# Patient Record
Sex: Male | Born: 1955 | Race: White | Hispanic: No | Marital: Married | Smoking: Never smoker
Health system: Southern US, Community
[De-identification: ages and names within clinical notes are randomized; demographics above are authoritative.]

## PROBLEM LIST (undated history)

## (undated) DIAGNOSIS — D3131 Benign neoplasm of right choroid: Secondary | ICD-10-CM

## (undated) DIAGNOSIS — R569 Unspecified convulsions: Secondary | ICD-10-CM

## (undated) DIAGNOSIS — C719 Malignant neoplasm of brain, unspecified: Secondary | ICD-10-CM

## (undated) DIAGNOSIS — R32 Unspecified urinary incontinence: Secondary | ICD-10-CM

## (undated) DIAGNOSIS — E785 Hyperlipidemia, unspecified: Secondary | ICD-10-CM

## (undated) DIAGNOSIS — R519 Headache, unspecified: Secondary | ICD-10-CM

## (undated) DIAGNOSIS — R51 Headache: Secondary | ICD-10-CM

## (undated) DIAGNOSIS — I10 Essential (primary) hypertension: Secondary | ICD-10-CM

## (undated) HISTORY — PX: BRAIN BIOPSY: SHX905

---

## 2013-03-10 DIAGNOSIS — C719 Malignant neoplasm of brain, unspecified: Secondary | ICD-10-CM

## 2013-03-10 HISTORY — DX: Malignant neoplasm of brain, unspecified: C71.9

## 2014-09-24 ENCOUNTER — Emergency Department (HOSPITAL_COMMUNITY): Payer: BLUE CROSS/BLUE SHIELD

## 2014-09-24 ENCOUNTER — Encounter (HOSPITAL_COMMUNITY): Payer: Self-pay | Admitting: Emergency Medicine

## 2014-09-24 ENCOUNTER — Observation Stay (HOSPITAL_COMMUNITY)
Admission: EM | Admit: 2014-09-24 | Discharge: 2014-09-25 | Disposition: A | Discharge status: Home / Self Care | Payer: BLUE CROSS/BLUE SHIELD | Attending: Internal Medicine | Admitting: Internal Medicine

## 2014-09-24 DIAGNOSIS — E871 Hypo-osmolality and hyponatremia: Secondary | ICD-10-CM | POA: Diagnosis not present

## 2014-09-24 DIAGNOSIS — C719 Malignant neoplasm of brain, unspecified: Secondary | ICD-10-CM | POA: Diagnosis present

## 2014-09-24 DIAGNOSIS — Z79899 Other long term (current) drug therapy: Secondary | ICD-10-CM | POA: Insufficient documentation

## 2014-09-24 DIAGNOSIS — I10 Essential (primary) hypertension: Secondary | ICD-10-CM | POA: Diagnosis not present

## 2014-09-24 DIAGNOSIS — Z7952 Long term (current) use of systemic steroids: Secondary | ICD-10-CM | POA: Diagnosis not present

## 2014-09-24 DIAGNOSIS — E785 Hyperlipidemia, unspecified: Secondary | ICD-10-CM | POA: Insufficient documentation

## 2014-09-24 DIAGNOSIS — Z86018 Personal history of other benign neoplasm: Secondary | ICD-10-CM | POA: Insufficient documentation

## 2014-09-24 DIAGNOSIS — R55 Syncope and collapse: Principal | ICD-10-CM | POA: Insufficient documentation

## 2014-09-24 DIAGNOSIS — E860 Dehydration: Secondary | ICD-10-CM | POA: Diagnosis present

## 2014-09-24 HISTORY — DX: Unspecified convulsions: R56.9

## 2014-09-24 HISTORY — DX: Hyperlipidemia, unspecified: E78.5

## 2014-09-24 HISTORY — DX: Unspecified urinary incontinence: R32

## 2014-09-24 HISTORY — DX: Headache, unspecified: R51.9

## 2014-09-24 HISTORY — DX: Malignant neoplasm of brain, unspecified: C71.9

## 2014-09-24 HISTORY — DX: Benign neoplasm of right choroid: D31.31

## 2014-09-24 HISTORY — DX: Essential (primary) hypertension: I10

## 2014-09-24 HISTORY — DX: Headache: R51

## 2014-09-24 LAB — URINALYSIS, ROUTINE W REFLEX MICROSCOPIC
Bilirubin Urine: NEGATIVE
GLUCOSE, UA: NEGATIVE mg/dL
Hgb urine dipstick: NEGATIVE
Ketones, ur: NEGATIVE mg/dL
Leukocytes, UA: NEGATIVE
NITRITE: NEGATIVE
PH: 6 (ref 5.0–8.0)
Protein, ur: NEGATIVE mg/dL
Specific Gravity, Urine: 1.015 (ref 1.005–1.030)
Urobilinogen, UA: 1 mg/dL (ref 0.0–1.0)

## 2014-09-24 LAB — CBC WITH DIFFERENTIAL/PLATELET
Basophils Absolute: 0 10*3/uL (ref 0.0–0.1)
Basophils Relative: 0 % (ref 0–1)
Eosinophils Absolute: 0 10*3/uL (ref 0.0–0.7)
Eosinophils Relative: 0 % (ref 0–5)
HCT: 41.2 % (ref 39.0–52.0)
Hemoglobin: 14.7 g/dL (ref 13.0–17.0)
Lymphocytes Relative: 4 % — ABNORMAL LOW (ref 12–46)
Lymphs Abs: 0.4 10*3/uL — ABNORMAL LOW (ref 0.7–4.0)
MCH: 32.1 pg (ref 26.0–34.0)
MCHC: 35.7 g/dL (ref 30.0–36.0)
MCV: 90 fL (ref 78.0–100.0)
Monocytes Absolute: 0.4 10*3/uL (ref 0.1–1.0)
Monocytes Relative: 4 % (ref 3–12)
NEUTROS PCT: 92 % — AB (ref 43–77)
Neutro Abs: 10.4 10*3/uL — ABNORMAL HIGH (ref 1.7–7.7)
PLATELETS: 140 10*3/uL — AB (ref 150–400)
RBC: 4.58 MIL/uL (ref 4.22–5.81)
RDW: 13.7 % (ref 11.5–15.5)
WBC: 11.2 10*3/uL — ABNORMAL HIGH (ref 4.0–10.5)

## 2014-09-24 LAB — COMPREHENSIVE METABOLIC PANEL
ALT: 26 U/L (ref 17–63)
ANION GAP: 9 (ref 5–15)
AST: 21 U/L (ref 15–41)
Albumin: 3.2 g/dL — ABNORMAL LOW (ref 3.5–5.0)
Alkaline Phosphatase: 40 U/L (ref 38–126)
BUN: 25 mg/dL — AB (ref 6–20)
CO2: 24 mmol/L (ref 22–32)
Calcium: 8.1 mg/dL — ABNORMAL LOW (ref 8.9–10.3)
Chloride: 95 mmol/L — ABNORMAL LOW (ref 101–111)
Creatinine, Ser: 0.92 mg/dL (ref 0.61–1.24)
GFR calc Af Amer: >60 mL/min (ref >60)
GFR calc non Af Amer: >60 mL/min (ref >60)
GLUCOSE: 119 mg/dL — AB (ref 65–99)
Potassium: 4.6 mmol/L (ref 3.5–5.1)
Sodium: 128 mmol/L — ABNORMAL LOW (ref 135–145)
TOTAL PROTEIN: 5.7 g/dL — AB (ref 6.5–8.1)
Total Bilirubin: 0.7 mg/dL (ref 0.3–1.2)

## 2014-09-24 LAB — TROPONIN I: Troponin I: <0.03 ng/mL (ref <0.031)

## 2014-09-24 LAB — LACTIC ACID, PLASMA
Lactic Acid, Venous: 1.8 mmol/L (ref 0.5–2.0)
Lactic Acid, Venous: 2 mmol/L (ref 0.5–2.0)

## 2014-09-24 LAB — TSH: TSH: 0.342 u[IU]/mL — ABNORMAL LOW (ref 0.350–4.500)

## 2014-09-24 MED ORDER — SENNA 8.6 MG PO TABS: 1.0000 | ORAL_TABLET | Freq: Every day | ORAL | Status: DC | PRN

## 2014-09-24 MED ORDER — DEXAMETHASONE 4 MG PO TABS
4.0000 mg | ORAL_TABLET | Freq: Two times a day (BID) | ORAL | Status: DC
  Administered 2014-09-24 – 2014-09-25 (×2): 4 mg via ORAL
  Filled 2014-09-24 (×6): qty 1

## 2014-09-24 MED ORDER — PROCHLORPERAZINE MALEATE 5 MG PO TABS: 10.0000 mg | ORAL_TABLET | Freq: Three times a day (TID) | ORAL | Status: DC | PRN

## 2014-09-24 MED ORDER — SODIUM CHLORIDE 0.9 % IV BOLUS (SEPSIS)
500.0000 mL | Freq: Once | INTRAVENOUS | Status: AC
  Administered 2014-09-24: 500 mL via INTRAVENOUS

## 2014-09-24 MED ORDER — SODIUM CHLORIDE 0.9 % IV SOLN
INTRAVENOUS | Status: DC
  Administered 2014-09-24: via INTRAVENOUS

## 2014-09-24 MED ORDER — NORTRIPTYLINE HCL 10 MG PO CAPS
20.0000 mg | ORAL_CAPSULE | Freq: Every day | ORAL | Status: DC
  Administered 2014-09-24: 20 mg via ORAL
  Filled 2014-09-24 (×3): qty 2

## 2014-09-24 MED ORDER — PANTOPRAZOLE SODIUM 40 MG PO TBEC
40.0000 mg | DELAYED_RELEASE_TABLET | Freq: Every day | ORAL | Status: DC
  Filled 2014-09-24: qty 1

## 2014-09-24 MED ORDER — LISINOPRIL 10 MG PO TABS
10.0000 mg | ORAL_TABLET | Freq: Every day | ORAL | Status: DC
  Administered 2014-09-25: 10 mg via ORAL
  Filled 2014-09-24: qty 1

## 2014-09-24 MED ORDER — SODIUM CHLORIDE 0.9 % IV SOLN
INTRAVENOUS | Status: AC
  Administered 2014-09-24: 23:00:00 via INTRAVENOUS

## 2014-09-24 MED ORDER — AMLODIPINE BESYLATE 5 MG PO TABS
5.0000 mg | ORAL_TABLET | Freq: Every day | ORAL | Status: DC
  Administered 2014-09-25: 5 mg via ORAL
  Filled 2014-09-24: qty 1

## 2014-09-24 MED ORDER — LEVETIRACETAM 500 MG PO TABS
500.0000 mg | ORAL_TABLET | Freq: Two times a day (BID) | ORAL | Status: DC
  Administered 2014-09-24 – 2014-09-25 (×2): 500 mg via ORAL
  Filled 2014-09-24 (×2): qty 1

## 2014-09-24 MED ORDER — FAMOTIDINE 20 MG PO TABS
20.0000 mg | ORAL_TABLET | Freq: Every day | ORAL | Status: DC
  Filled 2014-09-24: qty 1

## 2014-09-24 MED ORDER — SODIUM CHLORIDE 0.9 % IV SOLN
INTRAVENOUS | Status: DC
  Administered 2014-09-24: 20:00:00 via INTRAVENOUS

## 2014-09-24 MED ORDER — DEXAMETHASONE 4 MG PO TABS
ORAL_TABLET | ORAL | Status: AC
  Filled 2014-09-24: qty 1

## 2014-09-24 MED ORDER — NORTRIPTYLINE HCL 10 MG PO CAPS
ORAL_CAPSULE | ORAL | Status: AC
  Filled 2014-09-24: qty 2

## 2014-09-24 NOTE — ED Provider Notes (Signed)
CSN: [id number]     Arrival date & time 09/24/14  1648 History   First MD Initiated Contact with Patient 09/24/14 1650     Chief Complaint  Patient presents with  . Loss of Consciousness     HPI Pt was seen at 1655.  Per EMS, pt and his family, c/o sudden onset and resolution of 2 separate episodes of syncope that occurred yesterday and today. Pt states yesterday he was walking into a restaurant when he "felt weak" and "went to the floor" having a syncopal episode.  Pt states today he was being pushed in a wheelchair from a family member's house to his car, stood up to get in the car, "felt weak" and "then passed out." Pt states for the past 2 to 3 weeks pt has been experiencing "shaking episodes," and "urinary incontinence." Pt endorses chronic headache since dx with "a brain tumor." Pt's family states pt has had minimal PO intake for the past few days and they are concerned regarding dehydration.  EMS gave IV NS 557ml en route. Denies abd pain, no N/V/D, no fevers, no neck or back pain, no focal motor weakness, no tingling/numbness in extremities.     [facility]: Dr. Courtney Paris Past Medical History  Diagnosis Date  . Urinary incontinence   . Hypertension   . Seizures   . Choroidal nevus of right eye   . Hyperlipidemia   . Headache   . Astrocytoma brain tumor 2015    [facility]   History reviewed. No pertinent past surgical history.  History  Substance Use Topics  . Smoking status: Never Smoker   . Smokeless tobacco: Not on file  . Alcohol Use: No    Review of Systems ROS: Statement: All systems negative except as marked or noted in the HPI; Constitutional: Negative for fever and chills. ; ; Eyes: Negative for eye pain, redness and discharge. ; ; ENMT: Negative for ear pain, hoarseness, nasal congestion, sinus pressure and sore throat. ; ; Cardiovascular: Negative for chest pain, palpitations, diaphoresis, dyspnea and peripheral edema. ; ; Respiratory: Negative for cough, wheezing and  stridor. ; ; Gastrointestinal: +poor PO intake. Negative for nausea, vomiting, diarrhea, abdominal pain, blood in stool, hematemesis, jaundice and rectal bleeding. . ; ; Genitourinary: Negative for dysuria, flank pain and hematuria. ; ; Musculoskeletal: Negative for back pain and neck pain. Negative for swelling.; ; Skin: Negative for pruritus, rash, abrasions, blisters, bruising and skin lesion.; ; Neuro: Negative for neck stiffness. Negative for extremity weakness, paresthesias, +syncope, "shaking episodes," chronic headache.      Allergies  Hydromorphone  Home Medications   Prior to Admission medications   Medication Sig Start Date End Date Taking? Authorizing Provider  amLODipine (NORVASC) 5 MG tablet Take 5 mg by mouth daily. 08/25/14  Yes Historical Provider, MD  dexamethasone (DECADRON) 2 MG tablet Take 4 mg by mouth 2 (two) times daily. 09/09/14  Yes Historical Provider, MD  levETIRAcetam (KEPPRA) 500 MG tablet Take 500 mg by mouth 2 (two) times daily. 09/06/14  Yes Historical Provider, MD  lisinopril-hydrochlorothiazide (PRINZIDE,ZESTORETIC) 10-12.5 MG per tablet Take 1 tablet by mouth daily. 09/21/14  Yes Historical Provider, MD  nortriptyline (PAMELOR) 10 MG capsule Take 20 mg by mouth at bedtime. 09/15/14  Yes Historical Provider, MD  omeprazole (PRILOSEC) 20 MG capsule Take 40 mg by mouth at bedtime as needed (Heartburn).  09/15/14 09/15/15 Yes Historical Provider, MD  prochlorperazine (COMPAZINE) 10 MG tablet Take 10 mg by mouth 3 (three) times daily  as needed. 09/15/14  Yes Historical Provider, MD  ranitidine (ZANTAC) 150 MG tablet Take 150 mg by mouth daily as needed.   Yes Historical Provider, MD  acetaminophen (TYLENOL) 325 MG tablet Take 650 mg by mouth every 8 (eight) hours as needed.    Historical Provider, MD  GLEOSTINE 100 MG capsule Take 100 mg by mouth every 6 (six) weeks. 08/23/14   Historical Provider, MD  GLEOSTINE 40 MG capsule Take 80 mg by mouth every 6 (six) weeks. 08/23/14    Historical Provider, MD  senna (SENOKOT) 8.6 MG tablet Take 1 tablet by mouth daily as needed for constipation.     Historical Provider, MD   BP 111/93 mmHg  Pulse 77  Temp(Src) 97.9 F (36.6 C) (Oral)  Resp 11  Ht 5\' 7"  (1.702 m)  Wt 195 lb (88.451 kg)  BMI 30.53 kg/m2  SpO2 99%   Filed Vitals:   09/24/14 1800 09/24/14 1815 09/24/14 1830 09/24/14 1900  BP: 100/78  111/93 102/79  Pulse: 81 77  72  Temp:      TempSrc:      Resp: 13 15 11 13   Height:      Weight:      SpO2: 100% 99%  96%     17:11:14 Orthostatic Vital Signs JK  Orthostatic Lying  - BP- Lying: 103/73 mmHg ; Pulse- Lying: 82  Orthostatic Sitting - BP- Sitting: 119/80 mmHg ; Pulse- Sitting: 81  Orthostatic Standing at 0 minutes - BP- Standing at 0 minutes: 106/90 mmHg ; Pulse- Standing at 0 minutes: 76      Physical Exam 1700: Physical examination:  Nursing notes reviewed; Vital signs and O2 SAT reviewed;  Constitutional: Well developed, Well nourished, In no acute distress; Head:  Normocephalic, atraumatic; Eyes: EOMI, PERRL, No scleral icterus; ENMT: Mouth and pharynx normal, Mucous membranes dry; Neck: Supple, Full range of motion, No lymphadenopathy; Cardiovascular: Regular rate and rhythm, No gallop; Respiratory: Breath sounds clear & equal bilaterally, No wheezes.  Speaking full sentences with ease, Normal respiratory effort/excursion; Chest: Nontender, Movement normal; Abdomen: Soft, Nontender, Nondistended, Normal bowel sounds; Genitourinary: No CVA tenderness; Extremities: Pulses normal, No tenderness, No edema, No calf edema or asymmetry.; Neuro: AA&Ox3, Major CN grossly intact. No facial droop. Speech clear. Grips equal. Strength 5/5 equal bilat UE's and LE's. Moves all extremities spontaneously and to command without apparent gross focal motor deficits.; Skin: Color normal, Warm, Dry.    ED Course  Procedures     EKG Interpretation   Date/Time:  Sunday September 24 2014 17:09:47 EDT Ventricular Rate:   72 PR Interval:  175 QRS Duration: 113 QT Interval:  394 QTC Calculation: 431 R Axis:   83 Text Interpretation:  Sinus rhythm Borderline intraventricular conduction  delay Borderline low voltage, extremity leads Artifact No old tracing to  compare Confirmed by [facility]  MD, Nunzio Cory [phone]) on 09/24/2014 6:11:48 PM      MDM  MDM Reviewed: nursing note, vitals and previous chart Reviewed previous: MRI Interpretation: labs, ECG, x-ray and CT scan Total time providing critical care: 30-74 minutes. This excludes time spent performing separately reportable procedures and services. Consults: admitting MD   CRITICAL CARE Performed by: Alfonzo Feller Total critical care time: 35 Critical care time was exclusive of separately billable procedures and treating other patients. Critical care was necessary to treat or prevent imminent or life-threatening deterioration. Critical care was time spent personally by me on the following activities: development of treatment plan with patient and/or surrogate as well  as nursing, discussions with consultants, evaluation of patient's response to treatment, examination of patient, obtaining history from patient or surrogate, ordering and performing treatments and interventions, ordering and review of laboratory studies, ordering and review of radiographic studies, pulse oximetry and re-evaluation of patient's condition.    Results for orders placed or performed during the hospital encounter of 09/24/14  Urinalysis, Routine w reflex microscopic (not at [facility])  Result Value Ref Range   Color, Urine YELLOW YELLOW   APPearance CLEAR CLEAR   Specific Gravity, Urine 1.015 1.005 - 1.030   pH 6.0 5.0 - 8.0   Glucose, UA NEGATIVE NEGATIVE mg/dL   Hgb urine dipstick NEGATIVE NEGATIVE   Bilirubin Urine NEGATIVE NEGATIVE   Ketones, ur NEGATIVE NEGATIVE mg/dL   Protein, ur NEGATIVE NEGATIVE mg/dL   Urobilinogen, UA 1.0 0.0 - 1.0 mg/dL   Nitrite NEGATIVE  NEGATIVE   Leukocytes, UA NEGATIVE NEGATIVE  Comprehensive metabolic panel  Result Value Ref Range   Sodium 128 (L) 135 - 145 mmol/L   Potassium 4.6 3.5 - 5.1 mmol/L   Chloride 95 (L) 101 - 111 mmol/L   CO2 24 22 - 32 mmol/L   Glucose, Bld 119 (H) 65 - 99 mg/dL   BUN 25 (H) 6 - 20 mg/dL   Creatinine, Ser 0.92 0.61 - 1.24 mg/dL   Calcium 8.1 (L) 8.9 - 10.3 mg/dL   Total Protein 5.7 (L) 6.5 - 8.1 g/dL   Albumin 3.2 (L) 3.5 - 5.0 g/dL   AST 21 15 - 41 U/L   ALT 26 17 - 63 U/L   Alkaline Phosphatase 40 38 - 126 U/L   Total Bilirubin 0.7 0.3 - 1.2 mg/dL   GFR calc non Af Amer >60 >60 mL/min   GFR calc Af Amer >60 >60 mL/min   Anion gap 9 5 - 15  Troponin I  Result Value Ref Range   Troponin I <0.03 <0.031 ng/mL  Lactic acid, plasma  Result Value Ref Range   Lactic Acid, Venous 1.8 0.5 - 2.0 mmol/L  CBC with Differential  Result Value Ref Range   WBC 11.2 (H) 4.0 - 10.5 K/uL   RBC 4.58 4.22 - 5.81 MIL/uL   Hemoglobin 14.7 13.0 - 17.0 g/dL   HCT 41.2 39.0 - 52.0 %   MCV 90.0 78.0 - 100.0 fL   MCH 32.1 26.0 - 34.0 pg   MCHC 35.7 30.0 - 36.0 g/dL   RDW 13.7 11.5 - 15.5 %   Platelets 140 (L) 150 - 400 K/uL   Neutrophils Relative % 92 (H) 43 - 77 %   Neutro Abs 10.4 (H) 1.7 - 7.7 K/uL   Lymphocytes Relative 4 (L) 12 - 46 %   Lymphs Abs 0.4 (L) 0.7 - 4.0 K/uL   Monocytes Relative 4 3 - 12 %   Monocytes Absolute 0.4 0.1 - 1.0 K/uL   Eosinophils Relative 0 0 - 5 %   Eosinophils Absolute 0.0 0.0 - 0.7 K/uL   Basophils Relative 0 0 - 1 %   Basophils Absolute 0.0 0.0 - 0.1 K/uL   Dg Chest 2 View 09/24/2014   CLINICAL DATA:  Dehydration  EXAM: CHEST - 2 VIEW  COMPARISON:  None.  FINDINGS: The heart size and mediastinal contours are within normal limits. Both lungs are clear. The visualized skeletal structures are unremarkable.  IMPRESSION: No active disease.   Electronically Signed   By: Inez Catalina M.D.   On: 09/24/2014 17:55   Ct Head Wo Contrast  09/24/2014   CLINICAL DATA:  Loss  of consciousness, dehydration, headache  EXAM: CT HEAD WITHOUT CONTRAST  TECHNIQUE: Contiguous axial images were obtained from the base of the skull through the vertex without intravenous contrast.  COMPARISON:  None.  FINDINGS: Infiltrating hypodense mass with calcifications in the right frontoparietal region and extending into the right temporal lobe, measuring 6.1 x 5.2 cm (series 2/ image 22), corresponding to patient's known astrocytoma.  Faint hyperdensity along the anterior/inferior aspect of the mass could reflect a small amount of hemorrhage (series 2/ image 65), age indeterminate in this patient with history of recent prior hemorrhage.  Mild mass effect with overlying sulcal effacement. No midline shift. Basal cisterns remain patent.  No evidence of intraventricular hemorrhage.  No hydrocephalus.  Subcortical white matter and periventricular small vessel ischemic changes.  Cerebral volume is within normal limits.  No ventriculomegaly.  The visualized paranasal sinuses are essentially clear. The mastoid air cells are unopacified.  No evidence of calvarial fracture.  IMPRESSION: Infiltrating 6.1 cm mass in the right frontoparietal region extending into the right temporal lobe, corresponding to known astrocytoma.  Faint hyperdensity along the anterior/inferior aspect of the mass could reflect a small amount of hemorrhage, age indeterminate in this patient with history of recent prior hemorrhage.  No midline shift, intraventricular hemorrhage, or hydrocephalus. Basal cisterns remain patent.   Electronically Signed   By: Julian Hy M.D.   On: 09/24/2014 18:04    2005:  Care Everywhere records reviewed ([facility]):  MRI brain 09/15/14 with stable hemorrhage, Na baseline ranges 132-140.  No hx of hyponatremia. +orthostatic on VS. Total IVF NS x1L given (EMS and ED). Pt unable to urinate at this time; will continue IVF, I&O cath prn.  Neuro exam remains intact and unchanged. T/C to [facility] Dr. Veryl Speak (on  call for Dr. Simmie Davies), case discussed, including:  HPI, pertinent PM/SHx, VS/PE, dx testing, ED course and treatment:  [facility] does not have any beds at this time, will place pt on waiting list for tomorrow, pt's symptoms may be due to simple dehydration, admit to tele floor at [facility], give IVF and re-check labs in the morning; request to call [facility] Dr. Simmie Davies tomorrow morning; if pt is improving, pt may be able to be d/c to home, if he is not then he will need transfer to [facility] (they expect beds to open up tomorrow morning).   2030:  Dx and testing, as well as d/w Randon Goldsmith MD, d/w pt and family.  Questions answered.  Verb understanding, agreeable to admit. T/C to [facility] Dr. Maudie Mercury, case discussed, including:  HPI, pertinent PM/SHx, VS/PE, dx testing, ED course and treatment, including d/w [facility] MD:  Agreeable to admit, requests to write temporary orders, obtain observation tele bed to team APAdmits.   Francine Graven, DO 09/26/14 2224

## 2014-09-24 NOTE — ED Notes (Addendum)
Per EMS, pt had episode of LOC,syncope going from house to the car. Per EMS, pt family wants pt evaluated for dehydration due to minimal intake last several days. Pt reports headache at time of arrival. Pt reports several episodes of urinary incontinence and "seizure like" activity for last several weeks. Pt alert and oriented. Airway patent.

## 2014-09-24 NOTE — ED Notes (Signed)
Lab at bedside

## 2014-09-24 NOTE — H&P (Signed)
William Reyes is an 59 y.o. male.    William Reyes (pcp, [place], [place]) [facility]ology  Chief Complaint: syncope? HPI: 59 yo male with hx of astrocytoma, c/o headache/dizziness with standing up.  Pt had been resting today and ate lunch and then was wheeled out to car and sat there for a minute and went to get up and legs felt weak and he felt weak in general, sat down on the ground.  He may have had LOC for a minute.  Pt was able to respond to commands.  Pt presented to ED and was found to be orthostatic, and hyponatremic.  Dr. Thurnell Garbe apparently called [facility] who suggested hydration with ns iv for hyponatremia and orthostasis and if tomorrow not better to contact [facility] for transfer to [facility] as there were no beds tonight.   Past Medical History  Diagnosis Date  . Urinary incontinence   . Hypertension   . Seizures   . Choroidal nevus of right eye   . Hyperlipidemia   . Headache   . Astrocytoma brain tumor 2015    [facility]    Past Surgical History  Procedure Laterality Date  . Brain biopsy      Family History  Problem Relation Age of Onset  . Cancer Maternal Uncle    Social History:  reports that he has never smoked. He does not have any smokeless tobacco history on file. He reports that he does not drink alcohol or use illicit drugs.  Allergies:  Allergies  Allergen Reactions  . Hydromorphone Nausea And Vomiting and Other (See Comments)    Hallucinations    Medications reviewed  Results for orders placed or performed during the hospital encounter of 09/24/14 (from the past 48 hour(s))  Lactic acid, plasma     Status: None   Collection Time: 09/24/14  6:08 PM  Result Value Ref Range   Lactic Acid, Venous 1.8 0.5 - 2.0 mmol/L  Comprehensive metabolic panel     Status: Abnormal   Collection Time: 09/24/14  6:13 PM  Result Value Ref Range   Sodium 128 (L) 135 - 145 mmol/L   Potassium 4.6 3.5 - 5.1 mmol/L   Chloride 95 (L) 101 - 111 mmol/L   CO2 24 22 - 32 mmol/L    Glucose, Bld 119 (H) 65 - 99 mg/dL   BUN 25 (H) 6 - 20 mg/dL   Creatinine, Ser 0.92 0.61 - 1.24 mg/dL   Calcium 8.1 (L) 8.9 - 10.3 mg/dL   Total Protein 5.7 (L) 6.5 - 8.1 g/dL   Albumin 3.2 (L) 3.5 - 5.0 g/dL   AST 21 15 - 41 U/L   ALT 26 17 - 63 U/L   Alkaline Phosphatase 40 38 - 126 U/L   Total Bilirubin 0.7 0.3 - 1.2 mg/dL   GFR calc non Af Amer >60 >60 mL/min   GFR calc Af Amer >60 >60 mL/min    Comment: (NOTE) The eGFR has been calculated using the CKD EPI equation. This calculation has not been validated in all clinical situations. eGFR's persistently <60 mL/min signify possible Chronic Kidney Disease.    Anion gap 9 5 - 15  Troponin I     Status: None   Collection Time: 09/24/14  6:13 PM  Result Value Ref Range   Troponin I <0.03 <0.031 ng/mL    Comment:        NO INDICATION OF MYOCARDIAL INJURY.   CBC with Differential     Status: Abnormal  Collection Time: 09/24/14  6:13 PM  Result Value Ref Range   WBC 11.2 (H) 4.0 - 10.5 K/uL   RBC 4.58 4.22 - 5.81 MIL/uL   Hemoglobin 14.7 13.0 - 17.0 g/dL   HCT 41.2 39.0 - 52.0 %   MCV 90.0 78.0 - 100.0 fL   MCH 32.1 26.0 - 34.0 pg   MCHC 35.7 30.0 - 36.0 g/dL   RDW 13.7 11.5 - 15.5 %   Platelets 140 (L) 150 - 400 K/uL   Neutrophils Relative % 92 (H) 43 - 77 %   Neutro Abs 10.4 (H) 1.7 - 7.7 K/uL   Lymphocytes Relative 4 (L) 12 - 46 %   Lymphs Abs 0.4 (L) 0.7 - 4.0 K/uL   Monocytes Relative 4 3 - 12 %   Monocytes Absolute 0.4 0.1 - 1.0 K/uL   Eosinophils Relative 0 0 - 5 %   Eosinophils Absolute 0.0 0.0 - 0.7 K/uL   Basophils Relative 0 0 - 1 %   Basophils Absolute 0.0 0.0 - 0.1 K/uL   Dg Chest 2 View  09/24/2014   CLINICAL DATA:  Dehydration  EXAM: CHEST - 2 VIEW  COMPARISON:  None.  FINDINGS: The heart size and mediastinal contours are within normal limits. Both lungs are clear. The visualized skeletal structures are unremarkable.  IMPRESSION: No active disease.   Electronically Signed   By: Inez Catalina M.D.   On:  09/24/2014 17:55   Ct Head Wo Contrast  09/24/2014   CLINICAL DATA:  Loss of consciousness, dehydration, headache  EXAM: CT HEAD WITHOUT CONTRAST  TECHNIQUE: Contiguous axial images were obtained from the base of the skull through the vertex without intravenous contrast.  COMPARISON:  None.  FINDINGS: Infiltrating hypodense mass with calcifications in the right frontoparietal region and extending into the right temporal lobe, measuring 6.1 x 5.2 cm (series 2/ image 22), corresponding to patient's known astrocytoma.  Faint hyperdensity along the anterior/inferior aspect of the mass could reflect a small amount of hemorrhage (series 2/ image 44), age indeterminate in this patient with history of recent prior hemorrhage.  Mild mass effect with overlying sulcal effacement. No midline shift. Basal cisterns remain patent.  No evidence of intraventricular hemorrhage.  No hydrocephalus.  Subcortical white matter and periventricular small vessel ischemic changes.  Cerebral volume is within normal limits.  No ventriculomegaly.  The visualized paranasal sinuses are essentially clear. The mastoid air cells are unopacified.  No evidence of calvarial fracture.  IMPRESSION: Infiltrating 6.1 cm mass in the right frontoparietal region extending into the right temporal lobe, corresponding to known astrocytoma.  Faint hyperdensity along the anterior/inferior aspect of the mass could reflect a small amount of hemorrhage, age indeterminate in this patient with history of recent prior hemorrhage.  No midline shift, intraventricular hemorrhage, or hydrocephalus. Basal cisterns remain patent.   Electronically Signed   By: Julian Hy M.D.   On: 09/24/2014 18:04    Review of Systems  Constitutional: Negative for fever, chills, weight loss, malaise/fatigue and diaphoresis.  HENT: Negative.   Eyes: Negative.   Respiratory: Negative.   Cardiovascular: Negative.   Gastrointestinal: Negative.   Genitourinary: Negative.    Musculoskeletal: Negative.   Skin: Negative.   Neurological: Positive for dizziness, loss of consciousness and weakness. Negative for tingling, tremors, sensory change, focal weakness and seizures.  Endo/Heme/Allergies: Negative.   Psychiatric/Behavioral: Negative.     Blood pressure 102/79, pulse 72, temperature 97.9 F (36.6 C), temperature source Oral, resp. rate 13, height 5'  7" (1.702 m), weight 88.451 kg (195 lb), SpO2 96 %. Physical Exam  Constitutional: He is oriented to person, place, and time. He appears well-developed and well-nourished.  HENT:  Head: Normocephalic and atraumatic.  Mouth/Throat: No oropharyngeal exudate.  Eyes: Conjunctivae and EOM are normal. Pupils are equal, round, and reactive to light. No scleral icterus.  Neck: Normal range of motion. Neck supple. No JVD present. No tracheal deviation present. No thyromegaly present.  Cardiovascular: Normal rate and regular rhythm.  Exam reveals no gallop and no friction rub.   No murmur heard. Respiratory: Effort normal and breath sounds normal. No respiratory distress. He has no wheezes. He has no rales.  GI: Soft. Bowel sounds are normal. He exhibits no distension. There is no tenderness. There is no rebound and no guarding.  Musculoskeletal: Normal range of motion. He exhibits no edema or tenderness.  Lymphadenopathy:    He has no cervical adenopathy.  Neurological: He is alert and oriented to person, place, and time. He has normal reflexes. He displays normal reflexes. No cranial nerve deficit. He exhibits normal muscle tone. Coordination normal.  Skin: Skin is warm and dry. No rash noted. No erythema. No pallor.  Psychiatric: He has a normal mood and affect. His behavior is normal. Judgment and thought content normal.     Assessment/Plan Syncope Tele Trop i q6hx 3 Carotid US Cardiac echo  Hyponatremia Hydrate with ns iv Check serum osm, tsh, cortisol Check urine sodium, urine  osm  Thrombocytopenia Check cbc in am  Hyperglycemia Check hga1c  DVT prophylaxis:  scd  Disp:  Please call [facility] in am if not improving for transfer to there Jani Gravel 09/24/2014, 9:02 PM

## 2014-09-24 NOTE — ED Notes (Signed)
Pt attempting to void at this time. nad noted.

## 2014-09-24 NOTE — ED Notes (Signed)
In and out cath completed with assistance of NT after pt and wife agreed to procedure. Sample obtained and bladder emptied. Approx. 300 ml obtained. Pt tolerated well.

## 2014-09-24 NOTE — ED Notes (Signed)
Pt not able to give urine sample at this time. nad noted. Pt resting comfortably in bed.

## 2014-09-25 ENCOUNTER — Observation Stay (HOSPITAL_COMMUNITY): Payer: BLUE CROSS/BLUE SHIELD

## 2014-09-25 DIAGNOSIS — C719 Malignant neoplasm of brain, unspecified: Secondary | ICD-10-CM

## 2014-09-25 DIAGNOSIS — I10 Essential (primary) hypertension: Secondary | ICD-10-CM

## 2014-09-25 DIAGNOSIS — E871 Hypo-osmolality and hyponatremia: Secondary | ICD-10-CM

## 2014-09-25 DIAGNOSIS — E860 Dehydration: Secondary | ICD-10-CM

## 2014-09-25 DIAGNOSIS — R55 Syncope and collapse: Secondary | ICD-10-CM

## 2014-09-25 LAB — CBC
HCT: 41 % (ref 39.0–52.0)
Hemoglobin: 14.8 g/dL (ref 13.0–17.0)
MCH: 32.5 pg (ref 26.0–34.0)
MCHC: 36.1 g/dL — ABNORMAL HIGH (ref 30.0–36.0)
MCV: 89.9 fL (ref 78.0–100.0)
Platelets: 141 10*3/uL — ABNORMAL LOW (ref 150–400)
RBC: 4.56 MIL/uL (ref 4.22–5.81)
RDW: 13.5 % (ref 11.5–15.5)
WBC: 9.9 10*3/uL (ref 4.0–10.5)

## 2014-09-25 LAB — COMPREHENSIVE METABOLIC PANEL
ALBUMIN: 3.1 g/dL — AB (ref 3.5–5.0)
ALK PHOS: 42 U/L (ref 38–126)
ALT: 24 U/L (ref 17–63)
ANION GAP: 7 (ref 5–15)
AST: 16 U/L (ref 15–41)
BUN: 22 mg/dL — AB (ref 6–20)
CO2: 25 mmol/L (ref 22–32)
CREATININE: 0.76 mg/dL (ref 0.61–1.24)
Calcium: 8.4 mg/dL — ABNORMAL LOW (ref 8.9–10.3)
Chloride: 98 mmol/L — ABNORMAL LOW (ref 101–111)
GFR calc Af Amer: >60 mL/min (ref >60)
GFR calc non Af Amer: >60 mL/min (ref >60)
Glucose, Bld: 105 mg/dL — ABNORMAL HIGH (ref 65–99)
Potassium: 4.8 mmol/L (ref 3.5–5.1)
SODIUM: 130 mmol/L — AB (ref 135–145)
Total Bilirubin: 0.8 mg/dL (ref 0.3–1.2)
Total Protein: 5.6 g/dL — ABNORMAL LOW (ref 6.5–8.1)

## 2014-09-25 LAB — OSMOLALITY: Osmolality: 279 mOsm/kg (ref 275–300)

## 2014-09-25 LAB — TROPONIN I: Troponin I: <0.03 ng/mL (ref <0.031)

## 2014-09-25 LAB — SODIUM, URINE, RANDOM: Sodium, Ur: 81 mmol/L

## 2014-09-25 LAB — CORTISOL: Cortisol, Plasma: 1.3 ug/dL

## 2014-09-25 LAB — OSMOLALITY, URINE: Osmolality, Ur: 454 mOsm/kg (ref 390–1090)

## 2014-09-25 MED ORDER — SODIUM CHLORIDE 0.9 % IV BOLUS (SEPSIS)
1000.0000 mL | Freq: Once | INTRAVENOUS | Status: AC
  Administered 2014-09-25: 1000 mL via INTRAVENOUS

## 2014-09-25 NOTE — Care Management Note (Signed)
Case Management Note  Patient Details  Name: William Reyes MRN: [id number] Date of Birth: 1956/01/28  Expected Discharge Date:  09/25/14               Expected Discharge Plan:  Home/Self Care  In-House Referral:  Financial Counselor  Discharge planning Services  CM Consult  Post Acute Care Choice:  NA Choice offered to:  NA  DME Arranged:    DME Agency:     HH Arranged:    [place]:     Status of Service:  Completed, signed off  Medicare Important Message Given:    Date Medicare IM Given:    Medicare IM give by:    Date Additional Medicare IM Given:    Additional Medicare Important Message give by:     If discussed at [place] of Stay Meetings, dates discussed:    Additional Comments: Pt is from home and independent at baseline. Pt admitted for syncope. Pt undergoing cancer treatment at [facility]. Pt uninsured and has been referred to financial counselor for assistance. Patient discharging home today with self care. No new medications have been prescribed. Pt has no CM needs.   Sherald Barge, RN 09/25/2014, 1:37 PM

## 2014-09-25 NOTE — Progress Notes (Signed)
Patient Saturations on Room Air at Rest =100%  Patient Saturations on Room Air while Ambulating = 99%   

## 2014-09-25 NOTE — Discharge Summary (Signed)
Physician Discharge Summary  Real Cona JIR:[id number] DOB: 18-May-1955 DOA: 09/24/2014  PCP: Tempie Hoist, FNP  Admit date: 09/24/2014 Discharge date: 09/25/2014  Time spent: 40 minutes  Recommendations for Outpatient Follow-up:  1. Has follow up appointment with oncology at [facility] 09/29/14  Discharge Diagnoses:  Active Problems:   Syncope   Hyponatremia   Dehydration   Hypertension   Astrocytoma brain tumor   Discharge Condition: stable  Diet recommendation: heart healthy  Filed Weights   09/24/14 1650 09/24/14 2255 09/25/14 0544  Weight: 88.451 kg (195 lb) 88.45 kg (195 lb) 82.509 kg (181 lb 14.4 oz)    History of present illness:  59 yo male with hx of astrocytoma, c/o headache/dizziness with standing up presented to ED on 09/24/14. Pt had been resting and ate lunch and then was wheeled out to car and sat there for a minute and went to get up and legs felt weak and he felt weak in general, sat down on the ground. He may have had LOC for a minute. Pt was able to respond to commands. Pt presented to ED and was found to be orthostatic, and hyponatremic. Dr. Thurnell Garbe apparently called [facility] who suggested hydration with ns iv for hyponatremia and orthostasis and if tomorrow not better to contact [place] oncology for transfer to [facility] as there were no beds    Hospital Course:  Syncope: patient denies LOC. Reports symptoms somewhat chronic since treatment for brain tumor. No events on tele. Trop i negative x3. Evaluation/labs revealed dehydration. He received IV fluids and on day of discharge BP improved but slightly orthostatic from sitting to standing but rebounds after 121. Patient ambulated on day of discharge and reports dizziness at his baseline. He has appointment scheduled with oncology at [facility] 09/29/14.   Hyponatremia: he was hydrated with ns iv. At discharge sodium 130. He is not sure of what his baseline sodium is since beginning treatment. Urine sodium 81, urine  osmolality pending, serum osmolality pending and cortisol pending.  TSH 0.342.  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 Stable. No s/sx bleeding  Hyperglycemia: mild. Serum glucose 105 at discharge. likeley related to decedron  Procedures:  none  Consultations:  none  Discharge Exam: Filed Vitals:   09/25/14 0912  BP: 110/74  Pulse: 69  Temp: 97.7 F (36.5 C)  Resp: 20    General: well nourished appears comfortable Cardiovascular: rrr no MGR No LE edema Respiratory: normal effort BS clear bilaterally no wheeze  Discharge Instructions    Current Discharge Medication List    CONTINUE these medications which have NOT CHANGED   Details  amLODipine (NORVASC) 5 MG tablet Take 5 mg by mouth daily.    dexamethasone (DECADRON) 2 MG tablet Take 4 mg by mouth 2 (two) times daily.    levETIRAcetam (KEPPRA) 500 MG tablet Take 500 mg by mouth 2 (two) times daily.    lisinopril-hydrochlorothiazide (PRINZIDE,ZESTORETIC) 10-12.5 MG per tablet Take 1 tablet by mouth daily.    nortriptyline (PAMELOR) 10 MG capsule Take 20 mg by mouth at bedtime.    omeprazole (PRILOSEC) 20 MG capsule Take 40 mg by mouth at bedtime as needed (Heartburn).     prochlorperazine (COMPAZINE) 10 MG tablet Take 10 mg by mouth 3 (three) times daily as needed.    ranitidine (ZANTAC) 150 MG tablet Take 150 mg by mouth daily as needed.    acetaminophen (TYLENOL) 325 MG tablet Take 650 mg by mouth every  8 (eight) hours as needed.    !! GLEOSTINE 100 MG capsule Take 100 mg by mouth every 6 (six) weeks.    !! GLEOSTINE 40 MG capsule Take 80 mg by mouth every 6 (six) weeks.    senna (SENOKOT) 8.6 MG tablet Take 1 tablet by mouth daily as needed for constipation.      !! - Potential duplicate medications found. Please discuss with provider.     Allergies  Allergen Reactions  . Hydromorphone Nausea And Vomiting and Other (See Comments)     Hallucinations       The results of significant diagnostics from this hospitalization (including imaging, microbiology, ancillary and laboratory) are listed below for reference.    Significant Diagnostic Studies: Dg Chest 2 View  09/24/2014   CLINICAL DATA:  Dehydration  EXAM: CHEST - 2 VIEW  COMPARISON:  None.  FINDINGS: The heart size and mediastinal contours are within normal limits. Both lungs are clear. The visualized skeletal structures are unremarkable.  IMPRESSION: No active disease.   Electronically Signed   By: Inez Catalina M.D.   On: 09/24/2014 17:55   Ct Head Wo Contrast  09/24/2014   CLINICAL DATA:  Loss of consciousness, dehydration, headache  EXAM: CT HEAD WITHOUT CONTRAST  TECHNIQUE: Contiguous axial images were obtained from the base of the skull through the vertex without intravenous contrast.  COMPARISON:  None.  FINDINGS: Infiltrating hypodense mass with calcifications in the right frontoparietal region and extending into the right temporal lobe, measuring 6.1 x 5.2 cm (series 2/ image 22), corresponding to patient's known astrocytoma.  Faint hyperdensity along the anterior/inferior aspect of the mass could reflect a small amount of hemorrhage (series 2/ image 32), age indeterminate in this patient with history of recent prior hemorrhage.  Mild mass effect with overlying sulcal effacement. No midline shift. Basal cisterns remain patent.  No evidence of intraventricular hemorrhage.  No hydrocephalus.  Subcortical white matter and periventricular small vessel ischemic changes.  Cerebral volume is within normal limits.  No ventriculomegaly.  The visualized paranasal sinuses are essentially clear. The mastoid air cells are unopacified.  No evidence of calvarial fracture.  IMPRESSION: Infiltrating 6.1 cm mass in the right frontoparietal region extending into the right temporal lobe, corresponding to known astrocytoma.  Faint hyperdensity along the anterior/inferior aspect of the mass could  reflect a small amount of hemorrhage, age indeterminate in this patient with history of recent prior hemorrhage.  No midline shift, intraventricular hemorrhage, or hydrocephalus. Basal cisterns remain patent.   Electronically Signed   By: Julian Hy M.D.   On: 09/24/2014 18:04    Microbiology: No results found for this or any previous visit (from the past 240 hour(s)).   Labs: Basic Metabolic Panel:  Recent Labs Lab 09/24/14 1813 09/25/14 0555  NA 128* 130*  K 4.6 4.8  CL 95* 98*  CO2 24 25  GLUCOSE 119* 105*  BUN 25* 22*  CREATININE 0.92 0.76  CALCIUM 8.1* 8.4*   Liver Function Tests:  Recent Labs Lab 09/24/14 1813 09/25/14 0555  AST 21 16  ALT 26 24  ALKPHOS 40 42  BILITOT 0.7 0.8  PROT 5.7* 5.6*  ALBUMIN 3.2* 3.1*   No results for input(s): LIPASE, AMYLASE in the last 168 hours. No results for input(s): AMMONIA in the last 168 hours. CBC:  Recent Labs Lab 09/24/14 1813 09/25/14 0555  WBC 11.2* 9.9  NEUTROABS 10.4*  --   HGB 14.7 14.8  HCT 41.2 41.0  MCV 90.0 89.9  PLT 140* 141*   Cardiac Enzymes:  Recent Labs Lab 09/24/14 1813 09/24/14 2144 09/25/14 0231 09/25/14 0840  TROPONINI <0.03 <0.03 <0.03 <0.03   BNP: BNP (last 3 results) No results for input(s): BNP in the last 8760 hours.  ProBNP (last 3 results) No results for input(s): PROBNP in the last 8760 hours.  CBG: No results for input(s): GLUCAP in the last 168 hours.     SignedRadene Gunning  [facility] 09/25/2014, 12:36 PM   Attending note:  Patient seen and examined. Note reviewed as per Dyanne Carrel, NP.  This patient was admitted to the hospital with syncope, dehydration and hyponatremia. He was found to be orthostatic. Review of medications indicate that he was on thiazide diuretics. The patient was hydrated with intravenous saline with improvement of his serum sodium. He is feeling significantly improved. He is no longer lightheaded or dizzy when he is  ambulating. He was advised to continue with oral hydration at home. We'll hold his lisinopril/hydrochlorothiazide and amlodipine for now since blood pressures on the lower side. He plans to follow-up with his oncologist later this week. His blood pressure can be reviewed and if trending up, these medications may be resumed. Currently, he does not have any evidence of infection. The patient is otherwise stable for discharge.  MEMON,JEHANZEB

## 2014-09-25 NOTE — Progress Notes (Signed)
Pt's IV catheter removed and intact. Pt's IV site clean dry and intact. Discharge instructions and medications reviewed with patient. All questions were answered and no further questions at this time. Pt in stable condition and in no acute distress at time of discharge.

## 2014-12-09 DEATH — deceased

## 2015-12-07 IMAGING — CT CT HEAD W/O CM
1 series · 15 of 30 positions shown, 19 images · non-contrast
Comparison: None.

CLINICAL DATA: Loss of consciousness, dehydration, headache

EXAM:
CT HEAD WITHOUT CONTRAST
TECHNIQUE: Contiguous axial images were obtained from the base of the skull
through the vertex without intravenous contrast.

[Series 2: headseq 4.8 h37s · axial · 0.43mm/px · z∈[+106,+243]mm · 15 of 33 slices shown, 19 images]
[im 2/33  brain]
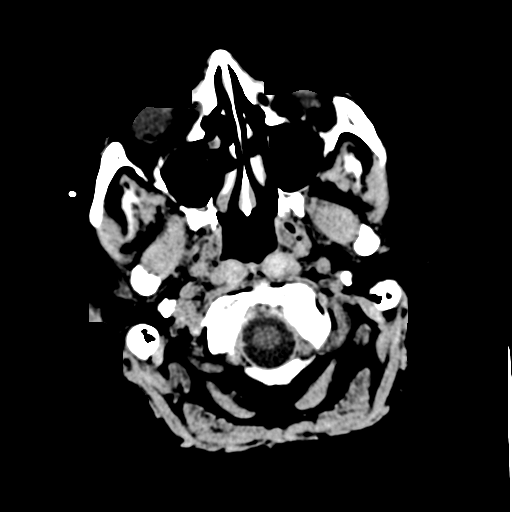
[im 2/33  bone]
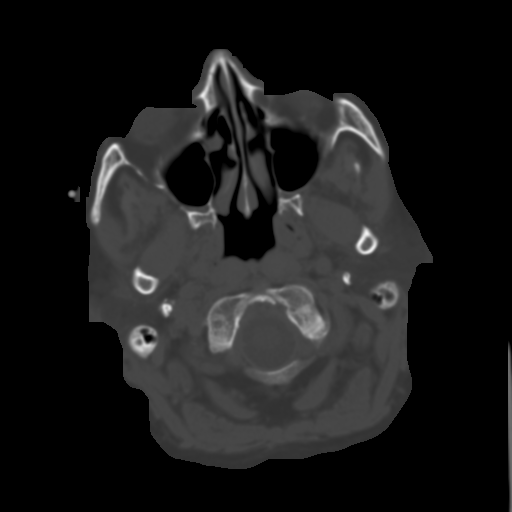
[im 4/33  brain]
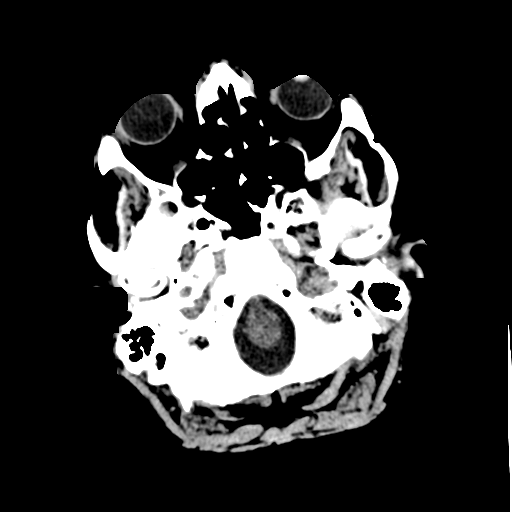
[im 6/33  brain]
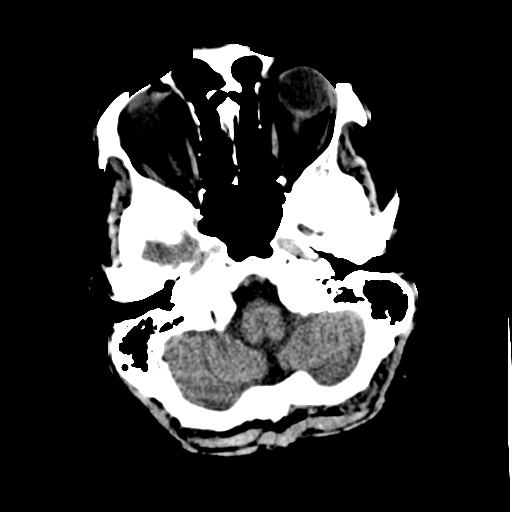
[im 8/33  brain]
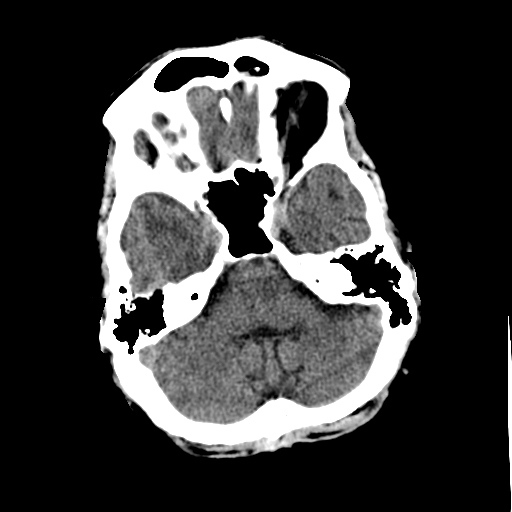
[im 10/33  brain]
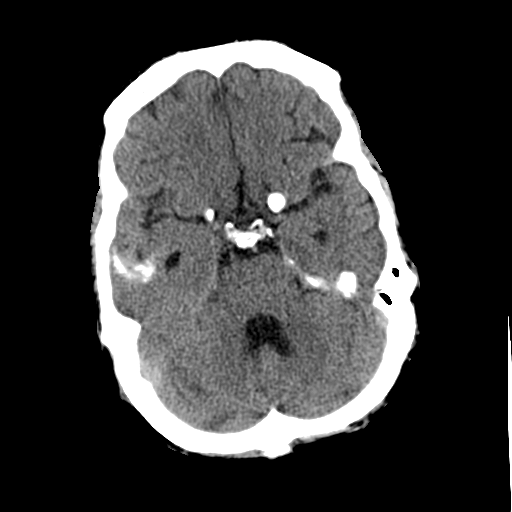
[im 10/33  bone]
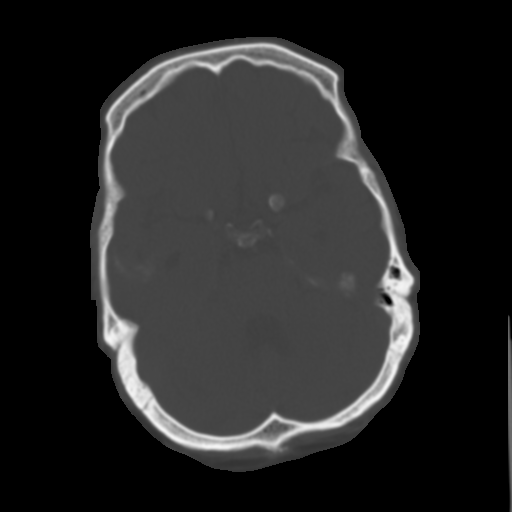
[im 13/33  brain]
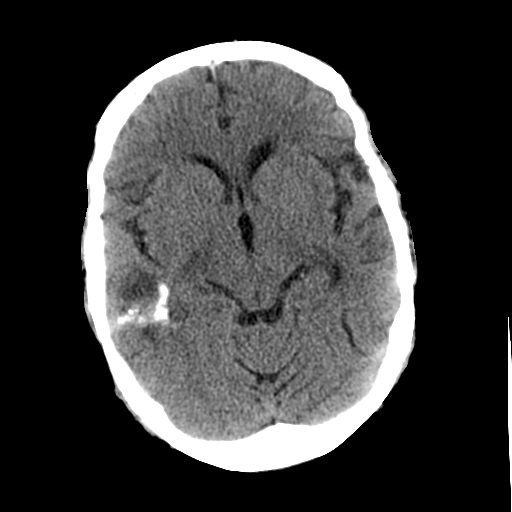
[im 15/33  brain]
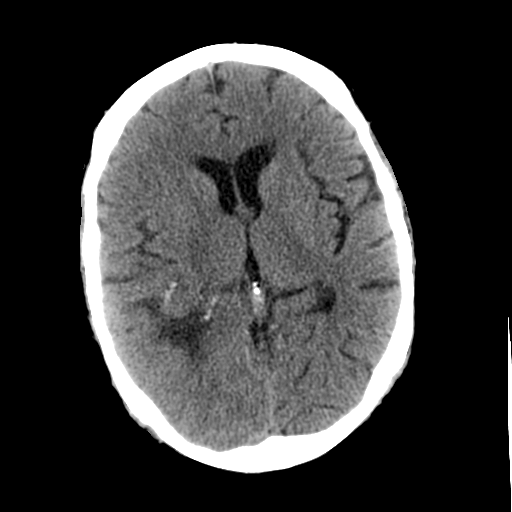
[im 17/33  brain]
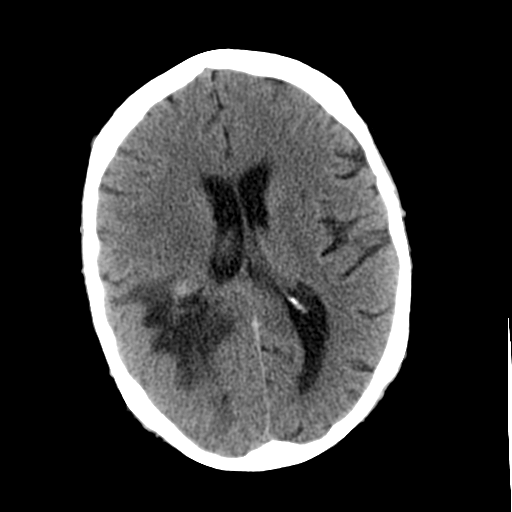
[im 18/33  brain]
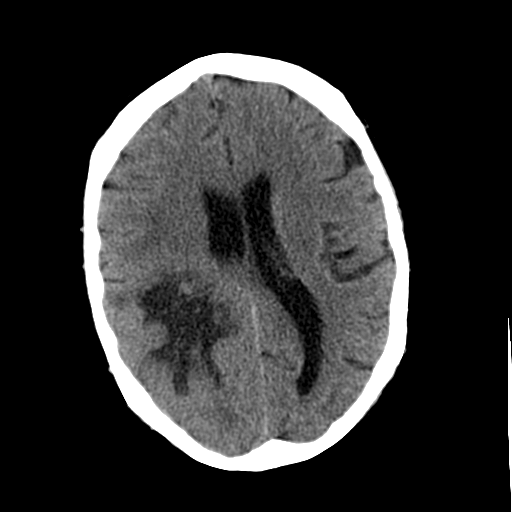
[im 18/33  bone]
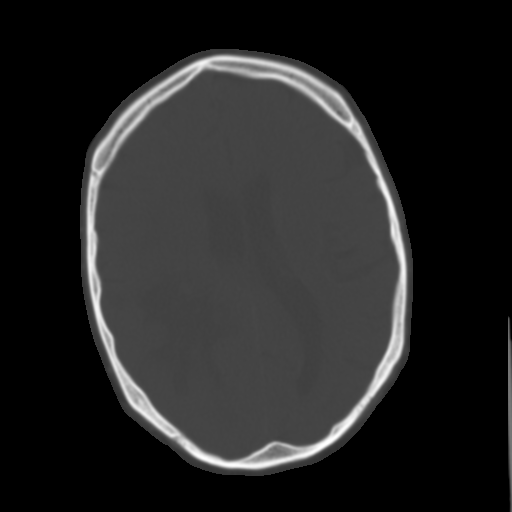
[im 20/33  brain]
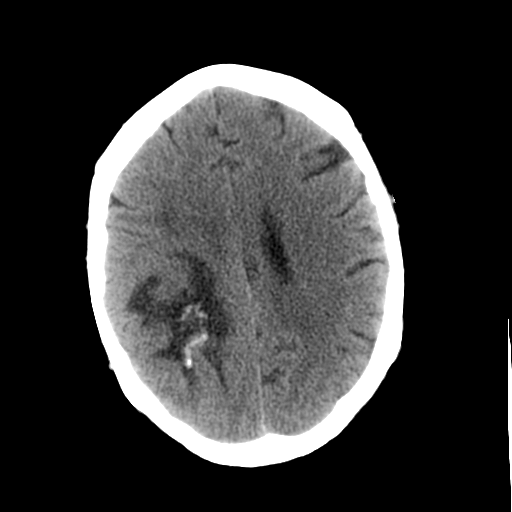
[im 23/33  brain]
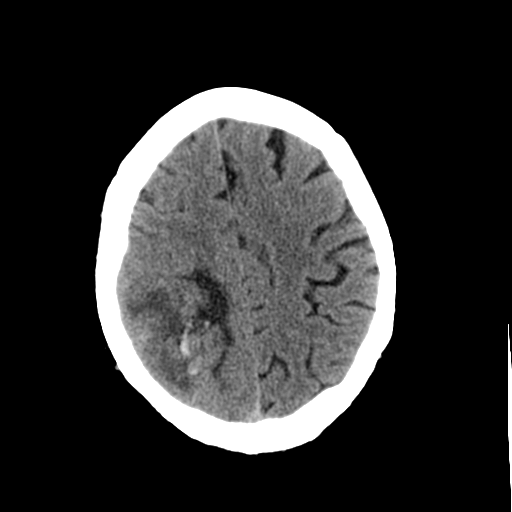
[im 25/33  brain]
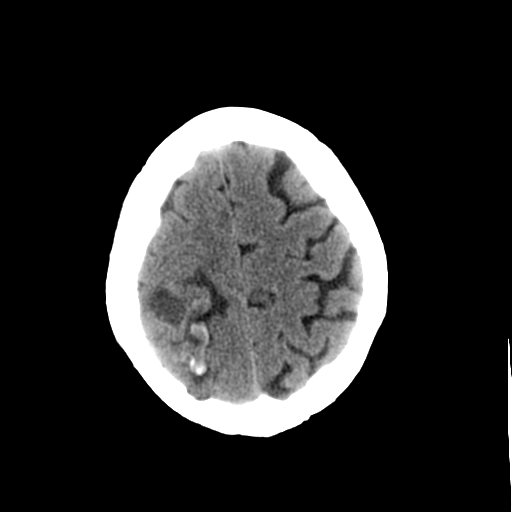
[im 27/33  brain]
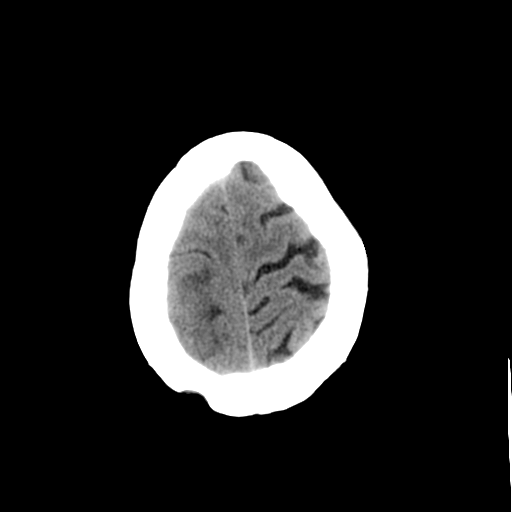
[im 27/33  bone]
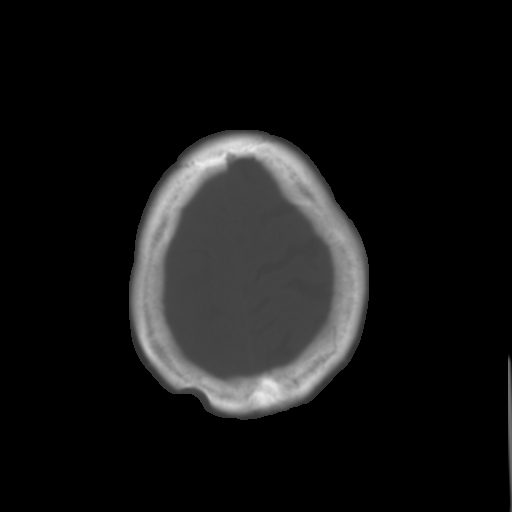
[im 29/33  brain]
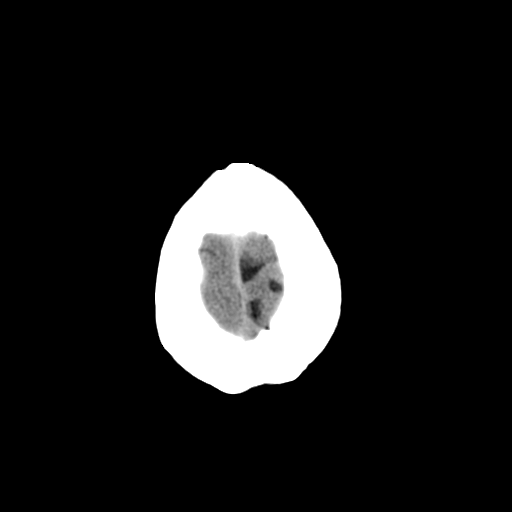
[im 31/33  brain]
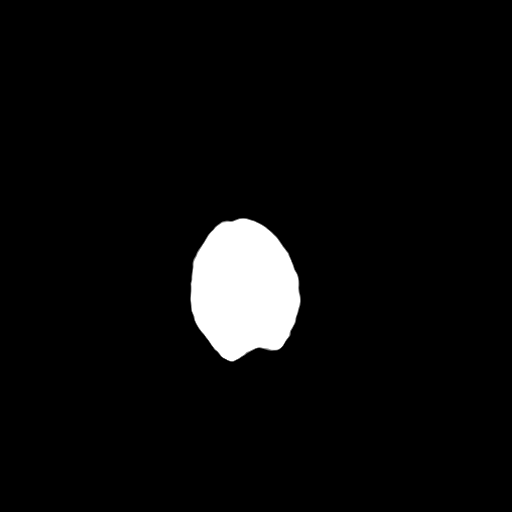

[15 of 30 positions shown; findings below may reference images not displayed]

FINDINGS: Infiltrating hypodense mass with calcifications in the right
frontoparietal region and extending into the right temporal lobe,
measuring 6.1 x 5.2 cm (series 2/ image 22), corresponding to
patient's known astrocytoma.

Faint hyperdensity along the anterior/inferior aspect of the mass
could reflect a small amount of hemorrhage (series 2/ image 17), age
indeterminate in this patient with history of recent prior
hemorrhage.

Mild mass effect with overlying sulcal effacement. No midline shift.
Basal cisterns remain patent.

No evidence of intraventricular hemorrhage.  No hydrocephalus.

Subcortical white matter and periventricular small vessel ischemic
changes.

Cerebral volume is within normal limits.  No ventriculomegaly.

The visualized paranasal sinuses are essentially clear. The mastoid
air cells are unopacified.

No evidence of calvarial fracture.
IMPRESSION: Infiltrating 6.1 cm mass in the right frontoparietal region
extending into the right temporal lobe, corresponding to known
astrocytoma.

Faint hyperdensity along the anterior/inferior aspect of the mass
could reflect a small amount of hemorrhage, age indeterminate in
this patient with history of recent prior hemorrhage.

No midline shift, intraventricular hemorrhage, or hydrocephalus.
Basal cisterns remain patent.
